# Patient Record
Sex: Male | Born: 1995 | Race: White | Hispanic: Yes | Marital: Married | Smoking: Never smoker
Health system: Southern US, Community
[De-identification: ages and names within clinical notes are randomized; demographics above are authoritative.]

---
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
## 2018-04-11 ENCOUNTER — Ambulatory Visit (INDEPENDENT_AMBULATORY_CARE_PROVIDER_SITE_OTHER): Payer: Self-pay | Admitting: Physician Assistant

## 2018-04-11 ENCOUNTER — Other Ambulatory Visit: Payer: Self-pay

## 2018-04-11 ENCOUNTER — Encounter: Payer: Self-pay | Admitting: Physician Assistant

## 2018-04-11 VITALS — BP 112/68 | HR 75 | Temp 97.5°F | Resp 16 | Ht 65.67 in | Wt 176.4 lb

## 2018-04-11 DIAGNOSIS — J069 Acute upper respiratory infection, unspecified: Secondary | ICD-10-CM

## 2018-04-11 MED ORDER — HYDROCODONE-HOMATROPINE 5-1.5 MG/5ML PO SYRP: 5.0000 mL | ORAL_SOLUTION | Freq: Three times a day (TID) | ORAL | 0 refills | Status: DC | PRN

## 2018-04-11 MED ORDER — BENZONATATE 100 MG PO CAPS: 100.0000 mg | ORAL_CAPSULE | Freq: Three times a day (TID) | ORAL | 0 refills | Status: DC | PRN

## 2018-04-11 NOTE — Progress Notes (Signed)
MRN: [id number] DOB: [birth year]  Subjective:   Justin Bell is a 22 y.o. male presenting for chief complaint of URI (cough, started with a sore throat Friday morning but gone, fever on Saturday and Sunday ) .  Reports one week history of illness. Started out wit sore throat and runny nose. Then developed dry hacking cough, keeping him up at night time. Notes he cannot get any sleep due to the cough. Coughing so hard sometimes feels like he has to vomit. Had "really high fever" a few days ago but does not know what it was, this resolved.  Has tried nyquil and tylenol with no relief. Denies sinus pain, ear pain, wheezing, shortness of breath, chest pain and myalgia, night sweats, chills, nausea, vomiting, abdominal pain and diarrhea. Has not had sick contact with anyone.  Has history of seasonal allergies, no history of asthma. Patient has not had flu shot this season. Denies smoking. Of note, patient also mentions he has nosebleeds whenever it gets really dry and hot outside.  Is not currently having one but is wondering what he can do to help with this.   [place] currently has no medications in their medication list. Also has No Known Allergies.  Justin Bell  has no past medical history on file. Also  has no past surgical history on file.     Objective:   Vitals: BP 112/68   Pulse 75   Temp (!) 97.5 F (36.4 C)   Resp 16   Ht 5' 5.67" (1.668 m)   Wt 176 lb 6.4 oz (80 kg)   SpO2 99%   BMI 28.76 kg/m   Physical Exam  Constitutional: He is oriented to person, place, and time. He appears well-developed and well-nourished. No distress.  HENT:  Head: Normocephalic and atraumatic.  Right Ear: Tympanic membrane, external ear and ear canal normal.  Left Ear: Tympanic membrane, external ear and ear canal normal.  Nose: Mucosal edema present. No epistaxis. Right sinus exhibits no maxillary sinus tenderness and no frontal sinus tenderness. Left sinus exhibits no maxillary sinus  tenderness and no frontal sinus tenderness.  Mouth/Throat: Uvula is midline and mucous membranes are normal. No posterior oropharyngeal edema, posterior oropharyngeal erythema or tonsillar abscesses. No tonsillar exudate.  Eyes: Conjunctivae are normal.  Neck: Normal range of motion.  Cardiovascular: Normal rate, regular rhythm, normal heart sounds and intact distal pulses.  Pulmonary/Chest: Effort normal and breath sounds normal. He has no decreased breath sounds. He has no wheezes. He has no rhonchi. He has no rales.  Lymphadenopathy:       Head (right side): No submental, no submandibular, no tonsillar, no preauricular, no posterior auricular and no occipital adenopathy present.       Head (left side): No submental, no submandibular, no tonsillar, no preauricular, no posterior auricular and no occipital adenopathy present.    He has no cervical adenopathy.       Right: No supraclavicular adenopathy present.       Left: No supraclavicular adenopathy present.  Neurological: He is alert and oriented to person, place, and time.  Skin: Skin is warm and dry.  Psychiatric: He has a normal mood and affect.  Vitals reviewed.   No results found for this or any previous visit (from the past 24 hour(s)).  Assessment and Plan :  1. Acute upper respiratory infection - Likely viral in etiology d/t reassuring physical exam findings. Vitals stable. Lungs CTAB. - Advised supportive care, offered symptomatic relief. - Contact clinic  if symptoms fail to improve in 5-7 days, otherwise return to clinic if sx worsen or as needed. -Recommended moisturizing nasal cavities to prevent nosebleeds.  Can use over-the-counter nasal saline rinses.  Can also use Afrin if having active nosebleed.  Return to clinic if having active nosebleed that he cannot get to stop. - benzonatate (TESSALON) 100 MG capsule; Take 1-2 capsules (100-200 mg total) by mouth 3 (three) times daily as needed for cough.  Dispense: 40 capsule;  Refill: 0 - HYDROcodone-homatropine (HYCODAN) 5-1.5 MG/5ML syrup; Take 5 mLs by mouth every 8 (eight) hours as needed for cough.  Dispense: 75 mL; Refill: 0  Side effects, risks, benefits, and alternatives of the medications and treatment plan prescribed today were discussed, and patient expressed understanding of the instructions given. No barriers to understanding were identified. Red flags discussed in detail. Pt expressed understanding regarding what to do in case of emergency/urgent symptoms.   Benjiman CoreBrittany Iliyah Bui, PA-C  Primary [facility] at [facility] 04/11/2018 9:46 AM

## 2018-04-11 NOTE — Patient Instructions (Addendum)
- We will treat this as a respiratory viral infection.  - I recommend you rest, drink plenty of fluids, eat light meals including soups.  - You may use cough syrup at night for your cough and sore throat, Tessalon pearls during the day. Be aware that cough syrup can definitely make you drowsy and sleepy so do not drive or operate any heavy machinery if it is affecting you during the day.  - You may also use Tylenol or ibuprofen over-the-counter for pain. Tea recipe for cough: boil water, add 2 inches shaved ginger root, steep 15 minutes, add juice from 2 full lemons, and 2 tbsp honey. -To help prevent nosebleeds when it is dry outside, use daily nasal saline rinses.  You can get this at the pharmacy.  You can also use over the counter Afrin if you are having an nosebleed to help stop the nosebleed. - Please let me know if you are not seeing any improvement or get worse in 5-7 days.     Upper Respiratory Infection, Adult Most upper respiratory infections (URIs) are caused by a virus. A URI affects the nose, throat, and upper air passages. The most common type of URI is often called "the common cold." Follow these instructions at home:  Take medicines only as told by your doctor.  Gargle warm saltwater or take cough drops to comfort your throat as told by your doctor.  Use a warm mist humidifier or inhale steam from a shower to increase air moisture. This may make it easier to breathe.  Drink enough fluid to keep your pee (urine) clear or pale yellow.  Eat soups and other clear broths.  Have a healthy diet.  Rest as needed.  Go back to work when your fever is gone or your doctor says it is okay. ? You may need to stay home longer to avoid giving your URI to others. ? You can also wear a face mask and wash your hands often to prevent spread of the virus.  Use your inhaler more if you have asthma.  Do not use any tobacco products, including cigarettes, chewing tobacco, or electronic  cigarettes. If you need help quitting, ask your doctor. Contact a doctor if:  You are getting worse, not better.  Your symptoms are not helped by medicine.  You have chills.  You are getting more short of breath.  You have brown or red mucus.  You have yellow or brown discharge from your nose.  You have pain in your face, especially when you bend forward.  You have a fever.  You have puffy (swollen) neck glands.  You have pain while swallowing.  You have white areas in the back of your throat. Get help right away if:  You have very bad or constant: ? Headache. ? Ear pain. ? Pain in your forehead, behind your eyes, and over your cheekbones (sinus pain). ? Chest pain.  You have long-lasting (chronic) lung disease and any of the following: ? Wheezing. ? Long-lasting cough. ? Coughing up blood. ? A change in your usual mucus.  You have a stiff neck.  You have changes in your: ? Vision. ? Hearing. ? Thinking. ? Mood. This information is not intended to replace advice given to you by your health care provider. Make sure you discuss any questions you have with your health care provider. Document Released: 03/26/2008 Document Revised: 06/10/2016 Document Reviewed: 01/13/2014 Elsevier Interactive Patient Education  2018 [employer]   IF you received an  x-ray today, you will receive an invoice from [facility]. Please contact [facility] at [phone] with questions or concerns regarding your invoice.   IF you received labwork today, you will receive an invoice from [facility]. Please contact [facility] at [phone] with questions or concerns regarding your invoice.   Our billing staff will not be able to assist you with questions regarding bills from these companies.  You will be contacted with the lab results as soon as they are available. The fastest way to get your results is to activate your My Chart account. Instructions are located on the  last page of this paperwork. If you have not heard from [place] regarding the results in 2 weeks, please contact this office.

## 2018-08-28 ENCOUNTER — Ambulatory Visit: Payer: Self-pay | Admitting: Emergency Medicine

## 2021-01-09 ENCOUNTER — Ambulatory Visit (INDEPENDENT_AMBULATORY_CARE_PROVIDER_SITE_OTHER): Payer: Self-pay | Admitting: Podiatry

## 2021-01-09 ENCOUNTER — Other Ambulatory Visit: Payer: Self-pay

## 2021-01-09 ENCOUNTER — Encounter: Payer: Self-pay | Admitting: Podiatry

## 2021-01-09 DIAGNOSIS — B351 Tinea unguium: Secondary | ICD-10-CM

## 2021-01-09 DIAGNOSIS — Z79899 Other long term (current) drug therapy: Secondary | ICD-10-CM

## 2021-01-09 DIAGNOSIS — L600 Ingrowing nail: Secondary | ICD-10-CM

## 2021-01-09 NOTE — Patient Instructions (Signed)

## 2021-01-11 NOTE — Progress Notes (Signed)
Subjective:   Patient ID: Justin Bell, male   DOB: 25 y.o.   MRN: [id number]   HPI 25 year old male presents the office for concerns of ingrown toenails but also his toes becoming thickened discolored.  He trims of the ingrown toenails about monthly.  He is recently trim them all the way back.  Currently denies any pain, redness or drainage or any swelling.  He has no other concerns today.  No recent treatment otherwise.   Review of Systems  All other systems reviewed and are negative.  History reviewed. No pertinent past medical history.  History reviewed. No pertinent surgical history.   Current Outpatient Medications:  .  benzonatate (TESSALON) 100 MG capsule, Take 1-2 capsules (100-200 mg total) by mouth 3 (three) times daily as needed for cough., Disp: 40 capsule, Rfl: 0 .  HYDROcodone-homatropine (HYCODAN) 5-1.5 MG/5ML syrup, Take 5 mLs by mouth every 8 (eight) hours as needed for cough., Disp: 75 mL, Rfl: 0  No Known Allergies        Objective:  Physical Exam  General: AAO x3, NAD  Dermatological: Nails appear to be hypertrophic, dystrophic with yellow-brown discoloration.  Incurvation likely present on bilateral hallux toenails but has recently cut them all the way out.  There is no edema, erythema, drainage or pus or any signs of infection peer there is no open lesions.  Vascular: Dorsalis Pedis artery and Posterior Tibial artery pedal pulses are 2/4 bilateral with immedate capillary fill time. There is no pain with calf compression, swelling, warmth, erythema.   Neruologic: Grossly intact via light touch bilateral.   Musculoskeletal: No gross boney pedal deformities bilateral. No pain, crepitus, or limitation noted with foot and ankle range of motion bilateral. Muscular strength 5/5 in all groups tested bilateral.  Gait: Unassisted, Nonantalgic.       Assessment:   Onychomycosis, ingrown toenail    Plan:  -Treatment options discussed including all  alternatives, risks, and complications -I discussed partial nail avulsions with chemical matricectomy but he wants to hold off on this.  We discussed treatment options the nail fungus.  After discussion regards oral, topical, alternative treatments he wants to proceed with oral Lamisil.  Discussed side effects and success rates.  We will check a CBC and LFT prior to starting medication.  Vivi Barrack DPM

## 2021-01-12 ENCOUNTER — Telehealth: Payer: Self-pay | Admitting: Podiatry

## 2021-01-12 LAB — CBC WITH DIFFERENTIAL/PLATELET
Absolute Monocytes: 476 cells/uL (ref 200–950)
Basophils Absolute: 21 cells/uL (ref 0–200)
Basophils Relative: 0.3 %
Eosinophils Absolute: 131 cells/uL (ref 15–500)
Eosinophils Relative: 1.9 %
HCT: 46.9 % (ref 38.5–50.0)
Hemoglobin: 15.6 g/dL (ref 13.2–17.1)
Lymphs Abs: 2926 cells/uL (ref 850–3900)
MCH: 28.4 pg (ref 27.0–33.0)
MCHC: 33.3 g/dL (ref 32.0–36.0)
MCV: 85.4 fL (ref 80.0–100.0)
MPV: 11.9 fL (ref 7.5–12.5)
Monocytes Relative: 6.9 %
Neutro Abs: 3347 cells/uL (ref 1500–7800)
Neutrophils Relative %: 48.5 %
Platelets: 252 10*3/uL (ref 140–400)
RBC: 5.49 10*6/uL (ref 4.20–5.80)
RDW: 13.5 % (ref 11.0–15.0)
Total Lymphocyte: 42.4 %
WBC: 6.9 10*3/uL (ref 3.8–10.8)

## 2021-01-12 LAB — HEPATIC FUNCTION PANEL
AG Ratio: 1.6 (calc) (ref 1.0–2.5)
ALT: 87 U/L — ABNORMAL HIGH (ref 9–46)
AST: 47 U/L — ABNORMAL HIGH (ref 10–40)
Albumin: 4.6 g/dL (ref 3.6–5.1)
Alkaline phosphatase (APISO): 100 U/L (ref 36–130)
Bilirubin, Direct: 0.1 mg/dL (ref 0.0–0.2)
Globulin: 2.8 g/dL (calc) (ref 1.9–3.7)
Indirect Bilirubin: 0.3 mg/dL (calc) (ref 0.2–1.2)
Total Bilirubin: 0.4 mg/dL (ref 0.2–1.2)
Total Protein: 7.4 g/dL (ref 6.1–8.1)

## 2021-01-12 NOTE — Telephone Encounter (Signed)
Pt called stating someone from our office tried to call him concerning his blood pressure. I'm not sure who called. Please advise.

## 2021-01-12 NOTE — Telephone Encounter (Signed)
Patient has requested return call regarding lab work, Please Advise

## 2021-01-13 NOTE — Telephone Encounter (Signed)
I spoke with Justin Bell and explain that his liver enzymes are elevated and at this time we cant do oral Lamisil. Pt will give [place] a call if he has any additional questions.

## 2021-01-13 NOTE — Telephone Encounter (Signed)
please let him know that the liver enzymes are elevated and we cannot do oral Lamisil. I am going to order a topical from [place] for the nail fungus

## 2021-02-09 ENCOUNTER — Ambulatory Visit: Payer: Self-pay | Admitting: Podiatry

## 2021-02-28 ENCOUNTER — Ambulatory Visit (INDEPENDENT_AMBULATORY_CARE_PROVIDER_SITE_OTHER): Payer: Self-pay | Admitting: Podiatry

## 2021-02-28 ENCOUNTER — Other Ambulatory Visit: Payer: Self-pay

## 2021-02-28 DIAGNOSIS — Z79899 Other long term (current) drug therapy: Secondary | ICD-10-CM

## 2021-02-28 DIAGNOSIS — L600 Ingrowing nail: Secondary | ICD-10-CM

## 2021-02-28 DIAGNOSIS — B351 Tinea unguium: Secondary | ICD-10-CM

## 2021-02-28 NOTE — Patient Instructions (Signed)
Terbinafine tablets What is this medicine? TERBINAFINE (TER bin a feen) is an antifungal medicine. It is used to treat certain kinds of fungal or yeast infections. This medicine may be used for other purposes; ask your health care provider or pharmacist if you have questions. COMMON BRAND NAME(S): Lamisil, Terbinex What should I tell my health care provider before I take this medicine? They need to know if you have any of these conditions:  drink alcoholic beverages  kidney disease  liver disease  an unusual or allergic reaction to terbinafine, other medicines, foods, dyes, or preservatives  pregnant or trying to get pregnant  breast-feeding How should I use this medicine? Take this medicine by mouth with a full glass of water. Follow the directions on the prescription label. You can take this medicine with food or on an empty stomach. Take your medicine at regular intervals. Do not take your medicine more often than directed. Do not skip doses or stop your medicine early even if you feel better. Do not stop taking except on your doctor's advice. A special MedGuide will be given to you by the pharmacist with each prescription and refill. Be sure to read this information carefully each time. Talk to your pediatrician regarding the use of this medicine in children. Special care may be needed. Overdosage: If you think you have taken too much of this medicine contact a poison control center or emergency room at once. NOTE: This medicine is only for you. Do not share this medicine with others. What if I miss a dose? If you miss a dose, take it as soon as you can. If it is almost time for your next dose, take only that dose. Do not take double or extra doses. What may interact with this medicine? Do not take this medicine with any of the following medications:  thioridazine This medicine may also interact with the following  medications:  beta-blockers  caffeine  cimetidine  cyclosporine  medicines for depression, anxiety, or psychotic disturbances  medicines for fungal infections like fluconazole and ketoconazole  medicines for irregular heartbeat like amiodarone, flecainide and propafenone  rifampin  warfarin This list may not describe all possible interactions. Give your health care provider a list of all the medicines, herbs, non-prescription drugs, or dietary supplements you use. Also tell them if you smoke, drink alcohol, or use illegal drugs. Some items may interact with your medicine. What should I watch for while using this medicine? Visit your doctor or health care provider regularly. Tell your doctor right away if you have nausea or vomiting, loss of appetite, stomach pain on your right upper side, yellow skin, dark urine, light stools, or are over tired. Some fungal infections need many weeks or months of treatment to cure. If you are taking this medicine for a long time, you will need to have important blood work done. This medicine may cause serious skin reactions. They can happen weeks to months after starting the medicine. Contact your health care provider right away if you notice fevers or flu-like symptoms with a rash. The rash may be red or purple and then turn into blisters or peeling of the skin. Or, you might notice a red rash with swelling of the face, lips or lymph nodes in your neck or under your arms. What side effects may I notice from receiving this medicine? Side effects that you should report to your doctor or health care professional as soon as possible:  allergic reactions like skin rash or hives,   swelling of the face, lips, or tongue  changes in vision  dark urine  fever or infection  general ill feeling or flu-like symptoms  light-colored stools  loss of appetite, nausea  rash, fever, and swollen lymph nodes  redness, blistering, peeling or loosening of the  skin, including inside the mouth  right upper belly pain  unusually weak or tired  yellowing of the eyes or skin Side effects that usually do not require medical attention (report to your doctor or health care professional if they continue or are bothersome):  changes in taste  diarrhea  hair loss  muscle or joint pain  stomach gas  stomach upset This list may not describe all possible side effects. Call your doctor for medical advice about side effects. You may report side effects to FDA at 1-800-FDA-1088. Where should I keep my medicine? Keep out of the reach of children. Store at room temperature below 25 degrees C (77 degrees F). Protect from light. Throw away any unused medicine after the expiration date. NOTE: This sheet is a summary. It may not cover all possible information. If you have questions about this medicine, talk to your doctor, pharmacist, or health care provider.  2021 Elsevier/Gold Standard (2019-01-16 15:37:07)  

## 2021-03-02 NOTE — Progress Notes (Signed)
    SUBJECTIVE:   CHIEF COMPLAINT / HPI: Establish care  Lives with wife Audry Riles, feels safe in relationship No regular exercise Denies smoking history, recreational drug use  Transaminitis Patient was noted to have elevation in AST/ALT 47/87 during visit with podiatrist as he had wanted to start oral Lamisil (which was not started due to transaminitis).  Patient is concerned about his elevated liver enzymes.   Takes Tylenol very occasionally, maybe once every 1-2 weeks.  Drinks 1-2 cans of beer/week. No regular medications.  Chest pain Has had a handful of times, last episode yesterday and typically lasts a few minutes before resolving.  Pain is worse with palpation.  Pain is not worse with exertion and he is not significantly improved with rest.  Patient denies family history of cardiac disease.  Denies chest pain currently.  Scalp pruritis Noticed spots on head 1-2 weeks ago after last haircut. Endorses itchiness for years.  PERTINENT  PMH / PSH: ingrown toenails (seeing podiatry, has been unable to do oral Lamisil due to transaminitis)  OBJECTIVE:   BP 130/78   Pulse 86   Ht 5\' 6"  (1.676 m)   Wt 203 lb (92.1 kg)   SpO2 98%   BMI 32.77 kg/m   General: Overweight young male, NAD CV: RRR, no murmurs, pain not reproducible with palpation Pulm: CTAB, no wheezes or rales Abdomen: Soft, nontender Derm: Scalp with scattered areas of scaliness  ASSESSMENT/PLAN:   Transaminitis Mild, etiology unclear.  We will repeat today and consider further work-up if still elevated.  HIV and HCV screening tests ordered.  Seborrheic dermatitis Scalp lesions consistent with seborrheic dermatitis, Rx provided for Selsun shampoo.  Atypical chest pain Sounds musculoskeletal in nature.  Pain is atypical and low likelihood of cardiac etiology especially given age and lack of family history.  We will continue to monitor.   Elevated BP Mildly elevated 138/82 initially but improved  to 130/78 on recheck.  Counseled patient on lifestyle changes.  - f/u 6 months  HCM - patient amenable to seeing HPV vaccine COVID-vaccine, Tdap, meningitis vaccine booster (which he is due for); however, patient is currently in the process of obtaining insurance so vaccines cannot be given today but patient will schedule RN appointment to receive vaccines - HIV screening and HCV screening performed, patient amenable   , MD [facility]

## 2021-03-02 NOTE — Patient Instructions (Addendum)
It was nice seeing you today!  Seborrheic dermatis is not contagious. Use the Selsun shampoo to treat this 1-2 times per week.  Best way to improve your blood pressure is to exercise, eat a low-sodium diet, and weight loss.  I will update you with the blood work.   Follow-up in 6 months or sooner if needed.  Please arrive at least 15 minutes prior to your scheduled appointments.  Stay well, Littie Deeds, MD [facility] [phone]    Seborrheic Dermatitis, Adult Seborrheic dermatitis is a skin disease that causes red, scaly patches. It usually occurs on the scalp, and it is often called dandruff. The patches may appear on other parts of the body. Skin patches tend to appear where there are many oil glands in the skin. Areas of the body that are commonly affected include the:  Scalp.  Ears.  Eyebrows.  Face.  Bearded area of [employer].  Skin folds of the body, such as the armpits, groin, and buttocks.  Chest. The condition may come and go for no known reason, and it is often long-lasting (chronic). What are the causes? The cause of this condition is not known. What increases the risk? The following factors may make you more likely to develop this condition:  Having certain conditions, such as: ? HIV (human immunodeficiency virus). ? AIDS (acquired immunodeficiency syndrome). ? Parkinson's disease. ? Mood disorders, such as depression.  Being 71-45 years old. What are the signs or symptoms? Symptoms of this condition include:  Thick scales on the scalp.  Redness on the face or in the armpits.  Skin that is flaky. The flakes may be white or yellow.  Skin that seems oily or dry but is not helped with moisturizers.  Itching or burning in the affected areas.   How is this diagnosed? This condition is diagnosed with a medical history and physical exam. A sample of your skin may be tested (skin biopsy). You may need to see a skin  specialist (dermatologist). How is this treated? There is no cure for this condition, but treatment can help to manage the symptoms. You may get treatment to remove scales, lower the risk of skin infection, and reduce swelling or itching. Treatment may include:  Creams that reduce skin yeast.  Medicated shampoo.  Moisturizing creams or ointments.  Creams that reduce swelling and irritation (steroids). Follow these instructions at home:  Apply over-the-counter and prescription medicines only as told by your health care provider.  Use any medicated shampoo, skin creams, or ointments only as told by your health care provider.  Keep all follow-up visits as told by your health care provider. This is important. Contact a health care provider if:  Your symptoms do not improve with treatment.  Your symptoms get worse.  You have new symptoms. Get help right away if:  Your condition rapidly worsens with treatment. Summary  Seborrheic dermatitis is a skin disease that causes red, scaly patches.  Seborrheic dermatitis commonly affects the scalp, face, and skin folds.  There is no cure for this condition, but treatment can help to manage the symptoms. This information is not intended to replace advice given to you by your health care provider. Make sure you discuss any questions you have with your health care provider. Document Revised: 07/16/2019 Document Reviewed: 07/16/2019 Elsevier Patient Education  2021 [employer].

## 2021-03-04 NOTE — Progress Notes (Signed)
Subjective: 25 year old male presents the office today for evaluation of nail fungus, skin fungus.  We did not do the oral Lamisil because of the liver function but he just wanted to see if he is able to do this.  He has been using topical medication without significant improvement.  He states the nails get ingrown he is to trim the nails back very far.  Currently without any pain. Denies any systemic complaints such as fevers, chills, nausea, vomiting. No acute changes since last appointment, and no other complaints at this time.   Objective: AAO x3, NAD DP/PT pulses palpable bilaterally, CRT less than 3 seconds There is minimal clear on the proximal nail fold but it is too early to tell if the topicals been helping.  The nails continue be hypertrophic, dystrophic but difficult to tell as the nails are almost completely cut back.  There is no pain in the nails there is no redness or drainage or any signs of infection.  No open lesions. No pain with calf compression, swelling, warmth, erythema  Assessment: Onychomycosis  Plan: -All treatment options discussed with the patient including all alternatives, risks, complications.  -He does want proceed with oral treatment given liver function I do want to do this at this time.  I will recheck a liver function test to see if we did do Lamisil need to do monthly checks on blood work.  For now continue topical.  I discussed with him nail removal total versus partial given the ingrown nail.  At this point I would recommend having the nail grow out as the nails are significantly cut back. -Patient encouraged to call the office with any questions, concerns, change in symptoms.   Vivi Barrack DPM

## 2021-03-06 ENCOUNTER — Other Ambulatory Visit: Payer: Self-pay

## 2021-03-06 ENCOUNTER — Ambulatory Visit (INDEPENDENT_AMBULATORY_CARE_PROVIDER_SITE_OTHER): Payer: Self-pay | Admitting: Family Medicine

## 2021-03-06 ENCOUNTER — Encounter: Payer: Self-pay | Admitting: Family Medicine

## 2021-03-06 VITALS — BP 130/78 | HR 86 | Ht 66.0 in | Wt 203.0 lb

## 2021-03-06 DIAGNOSIS — Z114 Encounter for screening for human immunodeficiency virus [HIV]: Secondary | ICD-10-CM

## 2021-03-06 DIAGNOSIS — Z1159 Encounter for screening for other viral diseases: Secondary | ICD-10-CM

## 2021-03-06 DIAGNOSIS — R7401 Elevation of levels of liver transaminase levels: Secondary | ICD-10-CM

## 2021-03-06 DIAGNOSIS — Z7689 Persons encountering health services in other specified circumstances: Secondary | ICD-10-CM

## 2021-03-06 DIAGNOSIS — L219 Seborrheic dermatitis, unspecified: Secondary | ICD-10-CM

## 2021-03-06 MED ORDER — SELENIUM SULFIDE 2.5 % EX LOTN: 1.0000 "application " | TOPICAL_LOTION | Freq: Every day | CUTANEOUS | 12 refills | Status: AC | PRN

## 2021-03-07 LAB — COMPREHENSIVE METABOLIC PANEL
ALT: 129 IU/L — ABNORMAL HIGH (ref 0–44)
AST: 70 IU/L — ABNORMAL HIGH (ref 0–40)
Albumin/Globulin Ratio: 1.4 (ref 1.2–2.2)
Albumin: 4.5 g/dL (ref 4.1–5.2)
Alkaline Phosphatase: 109 IU/L (ref 44–121)
BUN/Creatinine Ratio: 13 (ref 9–20)
BUN: 13 mg/dL (ref 6–20)
Bilirubin Total: 0.2 mg/dL (ref 0.0–1.2)
CO2: 19 mmol/L — ABNORMAL LOW (ref 20–29)
Calcium: 9.7 mg/dL (ref 8.7–10.2)
Chloride: 104 mmol/L (ref 96–106)
Creatinine, Ser: 0.97 mg/dL (ref 0.76–1.27)
Globulin, Total: 3.2 g/dL (ref 1.5–4.5)
Glucose: 95 mg/dL (ref 65–99)
Potassium: 4.6 mmol/L (ref 3.5–5.2)
Sodium: 142 mmol/L (ref 134–144)
Total Protein: 7.7 g/dL (ref 6.0–8.5)
eGFR: 112 mL/min/{1.73_m2} (ref >59)

## 2021-03-07 LAB — HCV AB W REFLEX TO QUANT PCR: HCV Ab: <0.1 s/co ratio (ref 0.0–0.9)

## 2021-03-07 LAB — HIV ANTIBODY (ROUTINE TESTING W REFLEX): HIV Screen 4th Generation wRfx: NONREACTIVE

## 2021-03-07 LAB — HCV INTERPRETATION

## 2021-03-13 ENCOUNTER — Telehealth: Payer: Self-pay | Admitting: Family Medicine

## 2021-03-13 DIAGNOSIS — R7401 Elevation of levels of liver transaminase levels: Secondary | ICD-10-CM

## 2021-03-13 NOTE — Telephone Encounter (Signed)
Spoke with patient over the phone to discuss lab results. AST and ALT still elevated. Patient denies FMHx of liver disease. Suspect NAFLD based on weight but will check for other causes. Discussed need for further labs and RUQ [place], patient in agreement. Offered to schedule lab appointment, but patient opted to call the clinic later to schedule this.  Labs ordered: CMP, lipid panel, HBV Ag, iron, ferritin, TIBC, TSH, CK  Labs should be fasting.  RUQ [place] ordered, will reach out to CMA to schedule.

## 2021-03-14 ENCOUNTER — Telehealth: Payer: Self-pay

## 2021-03-14 NOTE — Telephone Encounter (Signed)
Made appt for ultra sound at [facility] for June 15th at 7:45. Patient is no to eat or drink after midnight the night before appt he is to fast for ultra sound.  Patient would like reminder call a day before appt. Aquilla Solian, CMA

## 2021-03-14 NOTE — Telephone Encounter (Signed)
Made appt for patient at [facility] for June 15th at 7:45. Patient is not to eat or drink after midnight before the appt day. He is to fast for ultra sound. Patient would like a reminder call the day before appt. Aquilla Solian, CMA

## 2021-03-28 ENCOUNTER — Ambulatory Visit: Payer: Self-pay | Admitting: Podiatry

## 2021-04-05 ENCOUNTER — Other Ambulatory Visit: Payer: Self-pay

## 2022-06-21 ENCOUNTER — Encounter: Payer: Self-pay | Admitting: Family Medicine

## 2022-06-21 NOTE — Progress Notes (Deleted)
    SUBJECTIVE:   CHIEF COMPLAINT / HPI:  No chief complaint on file.   Noted to have mildly elevated AST/ALT during physical last year, RUQ [place] ordered but was not done.  PERTINENT  PMH / PSH: ***  Patient Care Team: Littie Deeds, MD as PCP - General (Family Medicine)   OBJECTIVE:   There were no vitals taken for this visit.  Physical Exam      03/06/2021    3:34 PM  Depression screen PHQ 2/9  Decreased Interest 0  Down, Depressed, Hopeless 0  PHQ - 2 Score 0  Altered sleeping 0  Tired, decreased energy 1  Change in appetite 1  Feeling bad or failure about yourself  0  Trouble concentrating 0  Moving slowly or fidgety/restless 0  Suicidal thoughts 0  PHQ-9 Score 2  Difficult doing work/chores Not difficult at all     {Show previous vital signs (optional):23777}  {Labs  Heme  Chem  Endocrine  Serology  Results Review (optional):23779}  ASSESSMENT/PLAN:   No problem-specific Assessment & Plan notes found for this encounter.    No follow-ups on file.   Littie Deeds, MD [facility]

## 2023-10-29 ENCOUNTER — Ambulatory Visit: Payer: Self-pay | Admitting: Podiatry

## 2024-05-02 ENCOUNTER — Encounter (HOSPITAL_COMMUNITY): Payer: Self-pay

## 2024-05-02 ENCOUNTER — Ambulatory Visit (HOSPITAL_COMMUNITY)
Admission: EM | Admit: 2024-05-02 | Discharge: 2024-05-02 | Disposition: A | Discharge status: Home / Self Care | Payer: Self-pay | Attending: Internal Medicine | Admitting: Internal Medicine

## 2024-05-02 DIAGNOSIS — H9202 Otalgia, left ear: Secondary | ICD-10-CM

## 2024-05-02 DIAGNOSIS — H6122 Impacted cerumen, left ear: Secondary | ICD-10-CM

## 2024-05-02 NOTE — ED Triage Notes (Signed)
 Patient reports that he was using a q tip and thinks the q tip broke off in his left ear and is now having pain.

## 2024-05-02 NOTE — ED Provider Notes (Signed)
 [facility]    CSN: [id number] Arrival date & time: 05/02/24  1331      History   Chief Complaint Chief Complaint  Patient presents with   Otalgia   Foreign Body in Ear    HPI Justin Bell is a 28 y.o. male.   28 year old male who presents urgent care with complaints of left ear pain and possible foreign body.  Patient reports last night his ear was itching significantly so he used a Q-tip in it.  He thought he may have lost part of the Q-tip in his ear.  He continued to have significant itching and his wife attempted to see if she could remove it.  He reports that today he is having pain in the ear.  He does have somewhat muffled hearing.  He denies any fevers or chills.   Otalgia Associated symptoms: no abdominal pain, no cough, no fever, no rash, no sore throat and no vomiting   Foreign Body in Ear Pertinent negatives include no chest pain, no abdominal pain and no shortness of breath.    History reviewed. No pertinent past medical history.  There are no active problems to display for this patient.   History reviewed. No pertinent surgical history.     Home Medications    Prior to Admission medications   Medication Sig Start Date End Date Taking? Authorizing Provider  selenium  sulfide (SELSUN ) 2.5 % shampoo Apply 1 application topically daily as needed for irritation. 03/06/21   Austin Ade, MD    Family History Family History  Problem Relation Age of Onset   Diabetes Mother    Kidney disease Father     Social History Social History   Tobacco Use   Smoking status: Never   Smokeless tobacco: Never  Vaping Use   Vaping status: Never Used  Substance Use Topics   Alcohol use: Never   Drug use: Never     Allergies   Patient has no known allergies.   Review of Systems Review of Systems  Constitutional:  Negative for chills and fever.  HENT:  Positive for ear pain. Negative for sore throat.   Eyes:  Negative for pain and visual  disturbance.  Respiratory:  Negative for cough and shortness of breath.   Cardiovascular:  Negative for chest pain and palpitations.  Gastrointestinal:  Negative for abdominal pain and vomiting.  Genitourinary:  Negative for dysuria and hematuria.  Musculoskeletal:  Negative for arthralgias and back pain.  Skin:  Negative for color change and rash.  Neurological:  Negative for seizures and syncope.  All other systems reviewed and are negative.    Physical Exam Triage Vital Signs ED Triage Vitals [05/02/24 1504]  Encounter Vitals Group     BP 114/76     Girls Systolic BP Percentile      Girls Diastolic BP Percentile      Boys Systolic BP Percentile      Boys Diastolic BP Percentile      Pulse Rate 66     Resp 14     Temp 98.3 F (36.8 C)     Temp Source Oral     SpO2 97 %     Weight      Height      Head Circumference      Peak Flow      Pain Score 8     Pain Loc      Pain Education      Exclude from Growth Chart  No data found.  Updated Vital Signs BP 114/76 (BP Location: Left Arm)   Pulse 66   Temp 98.3 F (36.8 C) (Oral)   Resp 14   SpO2 97%   Visual Acuity Right Eye Distance:   Left Eye Distance:   Bilateral Distance:    Right Eye Near:   Left Eye Near:    Bilateral Near:     Physical Exam Vitals and nursing note reviewed.  Constitutional:      General: He is not in acute distress.    Appearance: He is well-developed.  HENT:     Head: Normocephalic and atraumatic.     Right Ear: There is impacted cerumen (Partially impacted).     Left Ear: There is impacted cerumen.     Ears:     Comments: Tympanic membranes are clear status post irrigation    Mouth/Throat:     Mouth: Mucous membranes are moist.  Eyes:     Conjunctiva/sclera: Conjunctivae normal.  Cardiovascular:     Rate and Rhythm: Normal rate and regular rhythm.     Heart sounds: No murmur heard. Pulmonary:     Effort: Pulmonary effort is normal. No respiratory distress.     Breath  sounds: Normal breath sounds.  Abdominal:     Palpations: Abdomen is soft.     Tenderness: There is no abdominal tenderness.  Musculoskeletal:        General: No swelling.     Cervical back: Neck supple.  Skin:    General: Skin is warm and dry.     Capillary Refill: Capillary refill takes less than 2 seconds.  Neurological:     Mental Status: He is alert.  Psychiatric:        Mood and Affect: Mood normal.      UC Treatments / Results  Labs (all labs ordered are listed, but only abnormal results are displayed) Labs Reviewed - No data to display  EKG   Radiology No results found.  Procedures Procedures (including critical care time)  Medications Ordered in UC Medications - No data to display  Initial Impression / Assessment and Plan / UC Course  I have reviewed the triage vital signs and the nursing notes.  Pertinent labs & imaging results that were available during my care of the patient were reviewed by me and considered in my medical decision making (see chart for details).     Impacted cerumen of left ear  Otalgia of left ear   Impacted cerumen (ear wax) of the left ear, partially impacted cerumen (ear wax) of the right ear.  Ear irrigation done today with improvement in symptoms.  The tympanic membranes (ear drums) are clear without signs of infection.  We recommend the following: Can try over-the-counter Debrox to help with removing earwax. Do not use Q-tips in the ear.  Reserve Q-tips for the outer aspects of the ear only Return to urgent care or PCP if symptoms worsen or fail to resolve.    Final Clinical Impressions(s) / UC Diagnoses   Final diagnoses:  Impacted cerumen of left ear  Otalgia of left ear     Discharge Instructions      Impacted cerumen (ear wax) of the left ear, partially impacted cerumen (ear wax) of the right ear.  Ear irrigation done today.  The tympanic membranes (ear drums) are clear without signs of infection.  We recommend the  following: Can try over-the-counter Debrox to help with removing earwax. Do not use Q-tips in the ear.  Reserve Q-tips for the outer aspects of the ear only Return to urgent care or PCP if symptoms worsen or fail to resolve.      ED Prescriptions   None    PDMP not reviewed this encounter.   Teresa Almarie LABOR, PA-C 05/02/24 1608

## 2024-05-02 NOTE — Discharge Instructions (Addendum)
 Impacted cerumen (ear wax) of the left ear, partially impacted cerumen (ear wax) of the right ear.  Ear irrigation done today.  The tympanic membranes (ear drums) are clear without signs of infection.  We recommend the following: Can try over-the-counter Debrox to help with removing earwax. Do not use Q-tips in the ear.  Reserve Q-tips for the outer aspects of the ear only Return to urgent care or PCP if symptoms worsen or fail to resolve.

## 2024-09-16 ENCOUNTER — Encounter: Payer: Self-pay | Admitting: Nurse Practitioner

## 2024-09-16 ENCOUNTER — Ambulatory Visit: Payer: Self-pay | Admitting: Nurse Practitioner

## 2024-09-16 VITALS — BP 133/74 | HR 101 | Ht 64.0 in | Wt 216.4 lb

## 2024-09-16 DIAGNOSIS — R7303 Prediabetes: Secondary | ICD-10-CM

## 2024-09-16 DIAGNOSIS — L409 Psoriasis, unspecified: Secondary | ICD-10-CM

## 2024-09-16 DIAGNOSIS — Z130 Encounter for screening for diseases of the blood and blood-forming organs and certain disorders involving the immune mechanism: Secondary | ICD-10-CM

## 2024-09-16 DIAGNOSIS — Z0000 Encounter for general adult medical examination without abnormal findings: Secondary | ICD-10-CM

## 2024-09-16 DIAGNOSIS — Z1329 Encounter for screening for other suspected endocrine disorder: Secondary | ICD-10-CM

## 2024-09-16 DIAGNOSIS — Z13220 Encounter for screening for lipoid disorders: Secondary | ICD-10-CM

## 2024-09-16 DIAGNOSIS — Z13228 Encounter for screening for other metabolic disorders: Secondary | ICD-10-CM

## 2024-09-16 DIAGNOSIS — Z1321 Encounter for screening for nutritional disorder: Secondary | ICD-10-CM

## 2024-09-16 DIAGNOSIS — E669 Obesity, unspecified: Secondary | ICD-10-CM

## 2024-09-16 LAB — POCT GLYCOSYLATED HEMOGLOBIN (HGB A1C): Hemoglobin A1C: 6 % — AB (ref 4.0–5.6)

## 2024-09-16 NOTE — Assessment & Plan Note (Addendum)
  Routine wellness visit for life insurance. Blood pressure 133/74 mmHg. No medications or allergies. Family history of maternal diabetes and paternal renal dysfunction. Regular exercise and diet monitoring. No symptoms. - Ordered CMP, CBC, and lipid panel. - Advised moderate to vigorous exercise 30 minutes, five days a week. - Recommended heart-healthy, low salt, low fat diet with 70-80% vegetables and proteins, less carbohydrates, and portion control. - Discussed potential referral to the medical weight management clinic Advised to get HPV vaccine, influenza vaccine Tdap vaccine hepatitis B vaccine if not up-to-date

## 2024-09-16 NOTE — Assessment & Plan Note (Signed)
 Lab Results  Component Value Date   HGBA1C 6.0 (A) 09/16/2024   Counseled on low-carb diet, lose weight

## 2024-09-16 NOTE — Assessment & Plan Note (Addendum)
 Wt Readings from Last 3 Encounters:  09/16/24 216 lb 6.4 oz (98.2 kg)  03/06/21 203 lb (92.1 kg)  04/11/18 176 lb 6.4 oz (80 kg)   Body mass index is 37.14 kg/m.  Counseled on low-carb diet Encouraged moderate to vigorous exercise at least [place] 50 minutes weekly as tolerated Benefits of healthy weights discussed

## 2024-09-16 NOTE — Patient Instructions (Signed)

## 2024-09-16 NOTE — Assessment & Plan Note (Addendum)
 Continue Roflumilast 500 mg daily Maintain close follow-up with dermatology

## 2024-09-16 NOTE — Progress Notes (Signed)
 New Patient Office Visit  Subjective:  Patient ID: Justin Bell, male    DOB: [birth year]  Age: 28 y.o. MRN: [id number]  CC:  Chief Complaint  Patient presents with   Establish Care   Weight Management Screening   Labs Only    Overall health    HPI    Discussed the use of AI scribe software for clinical note transcription with the patient, who gave verbal consent to proceed.  History of Present Illness Justin Bell is a 28 year old male who presents for an annual physical exam.  He is establishing care due to a recent high glucose level identified during a life insurance evaluation. He has not had a primary care doctor recently.  He has a past medical history of fatty liver, identified years ago, though the specific diagnostic method is not recalled. He has not undergone any surgeries and is not currently taking any medications.  He has been going to the gym three days a week for two hours each session for the past two months, motivated by the life insurance findings. He is not on a strict diet but is mindful of his food intake, avoiding soda and juice, and focusing on water and natural juices.  No current symptoms such as fever, chills, chest pain, shortness of breath, abdominal pain, nausea, or vomiting. He feels generally well and has been able to rest and sleep adequately.  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 He is married, lives with his wife, and has two children. He does not use drugs, drink alcohol, or smoke.    Assessment & Plan    History reviewed. No pertinent past medical history.  History reviewed. No pertinent surgical history.  Family History  Problem Relation Age of Onset   Diabetes Mother    Kidney disease Father     Social History    Socioeconomic History   Marital status: Married    Spouse name: Not on file   Number of children: 2   Years of education: Not on file   Highest education level: Not on file  Occupational History   Not on file  Tobacco Use   Smoking status: Never   Smokeless tobacco: Never  Vaping Use   Vaping status: Never Used  Substance and Sexual Activity   Alcohol use: Never   Drug use: Never   Sexual activity: Yes    Birth control/protection: None  Other Topics Concern   Not on file  Social History Narrative   Lives with his wife    Social Drivers of [employer]er Strain: Not on file  Food Insecurity: Not on file  Transportation Needs: Not on file  Physical Activity: Not on file  Stress: Not on file  Social Connections: Not on file  Intimate Partner Violence: Not on file    ROS Review of Systems  Constitutional:  Negative for appetite change, chills, fatigue and fever.  HENT:  Negative for congestion, postnasal drip, rhinorrhea and sneezing.   Eyes:  Negative for pain, discharge and itching.  Respiratory:  Negative for cough, shortness of breath and wheezing.   Cardiovascular:  Negative for chest pain, palpitations and leg swelling.  Gastrointestinal:  Negative for abdominal pain, constipation, nausea and vomiting.  Endocrine: Negative for polydipsia, polyphagia and  polyuria.  Genitourinary:  Negative for difficulty urinating, dysuria, flank pain and frequency.  Musculoskeletal:  Negative for arthralgias, back pain, joint swelling and myalgias.  Skin:  Negative for color change, pallor and wound.  Neurological:  Negative for dizziness, facial asymmetry, weakness, numbness and headaches.  Psychiatric/Behavioral:  Negative for behavioral problems, confusion, self-injury and suicidal ideas.     Objective:   Today's Vitals: BP 133/74 (BP Location: Left Arm, Patient Position: Sitting, Cuff Size: Large)   Pulse (!) 101   Ht 5' 4 (1.626 m)   Wt 216 lb 6.4 oz  (98.2 kg)   SpO2 99%   BMI 37.14 kg/m   Physical Exam Vitals and nursing note reviewed.  Constitutional:      General: He is not in acute distress.    Appearance: Normal appearance. He is obese. He is not ill-appearing, toxic-appearing or diaphoretic.  HENT:     Right Ear: Tympanic membrane, ear canal and external ear normal. There is no impacted cerumen.     Left Ear: Tympanic membrane, ear canal and external ear normal. There is no impacted cerumen.     Nose: Nose normal. No congestion or rhinorrhea.     Mouth/Throat:     Mouth: Mucous membranes are moist.     Pharynx: Oropharynx is clear. No oropharyngeal exudate or posterior oropharyngeal erythema.  Eyes:     General: No scleral icterus.       Right eye: No discharge.        Left eye: No discharge.     Extraocular Movements: Extraocular movements intact.     Conjunctiva/sclera: Conjunctivae normal.  Neck:     Vascular: No carotid bruit.  Cardiovascular:     Rate and Rhythm: Normal rate and regular rhythm.     Pulses: Normal pulses.     Heart sounds: Normal heart sounds. No murmur heard.    No friction rub. No gallop.  Pulmonary:     Effort: Pulmonary effort is normal. No respiratory distress.     Breath sounds: Normal breath sounds. No stridor. No wheezing, rhonchi or rales.  Chest:     Chest wall: No tenderness.  Abdominal:     General: Bowel sounds are normal. There is no distension.     Palpations: Abdomen is soft. There is no mass.     Tenderness: There is no abdominal tenderness. There is no right CVA tenderness, left CVA tenderness, guarding or rebound.     Hernia: No hernia is present.  Musculoskeletal:        General: No swelling, tenderness, deformity or signs of injury.     Cervical back: Normal range of motion and neck supple. No rigidity or tenderness.     Right lower leg: No edema.     Left lower leg: No edema.  Lymphadenopathy:     Cervical: No cervical adenopathy.  Skin:    General: Skin is warm and  dry.     Capillary Refill: Capillary refill takes less than 2 seconds.     Coloration: Skin is not jaundiced or pale.     Findings: Rash present. No bruising, erythema or lesion.     Comments: Erythematous rashes noted on the scalp  Neurological:     Mental Status: He is alert and oriented to person, place, and time.     Cranial Nerves: No cranial nerve deficit.     Motor: No weakness.     Gait: Gait normal.  Psychiatric:        Mood  and Affect: Mood normal.        Behavior: Behavior normal.        Thought Content: Thought content normal.        Judgment: Judgment normal.     Assessment & Plan:   Problem List Items Addressed This Visit       Musculoskeletal and Integument   Scalp psoriasis   Continue Roflumilast 500 mg daily Maintain close follow-up with dermatology        Other   Annual physical exam - Primary    Routine wellness visit for life insurance. Blood pressure 133/74 mmHg. No medications or allergies. Family history of maternal diabetes and paternal renal dysfunction. Regular exercise and diet monitoring. No symptoms. - Ordered CMP, CBC, and lipid panel. - Advised moderate to vigorous exercise 30 minutes, five days a week. - Recommended heart-healthy, low salt, low fat diet with 70-80% vegetables and proteins, less carbohydrates, and portion control. - Discussed potential referral to the medical weight management clinic Advised to get HPV vaccine, influenza vaccine Tdap vaccine hepatitis B vaccine if not up-to-date       Obesity (BMI 30-39.9)   Wt Readings from Last 3 Encounters:  09/16/24 216 lb 6.4 oz (98.2 kg)  03/06/21 203 lb (92.1 kg)  04/11/18 176 lb 6.4 oz (80 kg)   Body mass index is 37.14 kg/m.  Counseled on low-carb diet Encouraged moderate to vigorous exercise at least [place] 50 minutes weekly as tolerated Benefits of healthy weights discussed      Relevant Orders   TSH   Prediabetes   Lab Results  Component Value Date   HGBA1C 6.0 (A)  09/16/2024   Counseled on low-carb diet, lose weight      Relevant Orders   POCT glycosylated hemoglobin (Hb A1C) (Completed)   Other Visit Diagnoses       Screening for endocrine, nutritional, metabolic and immunity disorder       Relevant Orders   CBC   CMP14+EGFR     Screening for lipid disorders       Relevant Orders   Lipid panel       Outpatient Encounter Medications as of 09/16/2024  Medication Sig   roflumilast (DALIRESP) 500 MCG TABS tablet Take 500 mcg by mouth daily.   selenium  sulfide (SELSUN ) 2.5 % shampoo Apply 1 application topically daily as needed for irritation. (Patient not taking: Reported on 09/16/2024)   No facility-administered encounter medications on file as of 09/16/2024.    Follow-up: Return in about 1 year (around 09/16/2025) for CPE.   Zemirah Krasinski R Nesa Distel, FNP

## 2024-09-23 ENCOUNTER — Other Ambulatory Visit: Payer: Self-pay

## 2025-09-20 ENCOUNTER — Encounter: Payer: Self-pay | Admitting: Nurse Practitioner
